# Patient Record
Sex: Female | Born: 1970 | Race: White | Hispanic: No | Marital: Married | Smoking: Never smoker
Health system: Southern US, Community
[De-identification: ages and names within clinical notes are randomized; demographics above are authoritative.]

## PROBLEM LIST (undated history)

## (undated) DIAGNOSIS — I10 Essential (primary) hypertension: Secondary | ICD-10-CM

## (undated) DIAGNOSIS — E039 Hypothyroidism, unspecified: Secondary | ICD-10-CM

## (undated) DIAGNOSIS — E119 Type 2 diabetes mellitus without complications: Secondary | ICD-10-CM

---

## 1997-10-29 ENCOUNTER — Observation Stay (HOSPITAL_COMMUNITY): Admission: RE | Admit: 1997-10-29 | Discharge: 1997-10-30 | Payer: Self-pay | Admitting: Surgery

## 1998-04-17 ENCOUNTER — Other Ambulatory Visit: Admission: RE | Admit: 1998-04-17 | Discharge: 1998-04-17 | Payer: Self-pay | Admitting: Obstetrics and Gynecology

## 1999-04-18 ENCOUNTER — Other Ambulatory Visit: Admission: RE | Admit: 1999-04-18 | Discharge: 1999-04-18 | Payer: Self-pay | Admitting: Obstetrics and Gynecology

## 2000-01-01 ENCOUNTER — Encounter: Payer: Self-pay | Admitting: Obstetrics and Gynecology

## 2000-01-01 ENCOUNTER — Ambulatory Visit (HOSPITAL_COMMUNITY): Admission: RE | Admit: 2000-01-01 | Discharge: 2000-01-01 | Payer: Self-pay | Admitting: Obstetrics and Gynecology

## 2000-04-26 ENCOUNTER — Encounter (INDEPENDENT_AMBULATORY_CARE_PROVIDER_SITE_OTHER): Payer: Self-pay | Admitting: Specialist

## 2000-04-26 ENCOUNTER — Inpatient Hospital Stay (HOSPITAL_COMMUNITY): Admission: AD | Admit: 2000-04-26 | Discharge: 2000-04-28 | Payer: Self-pay

## 2000-04-30 ENCOUNTER — Ambulatory Visit (HOSPITAL_COMMUNITY): Admission: AD | Admit: 2000-04-30 | Discharge: 2000-04-30 | Payer: Self-pay | Admitting: *Deleted

## 2000-05-25 ENCOUNTER — Other Ambulatory Visit: Admission: RE | Admit: 2000-05-25 | Discharge: 2000-05-25 | Payer: Self-pay | Admitting: Obstetrics and Gynecology

## 2001-05-31 ENCOUNTER — Other Ambulatory Visit: Admission: RE | Admit: 2001-05-31 | Discharge: 2001-05-31 | Payer: Self-pay | Admitting: Obstetrics and Gynecology

## 2002-08-14 ENCOUNTER — Other Ambulatory Visit: Admission: RE | Admit: 2002-08-14 | Discharge: 2002-08-14 | Payer: Self-pay | Admitting: Obstetrics and Gynecology

## 2008-06-17 ENCOUNTER — Emergency Department (HOSPITAL_BASED_OUTPATIENT_CLINIC_OR_DEPARTMENT_OTHER): Admission: EM | Admit: 2008-06-17 | Discharge: 2008-06-17 | Payer: Self-pay | Admitting: Emergency Medicine

## 2010-07-25 NOTE — H&P (Signed)
[facility]  Patient:    Kimberly Krueger, Kimberly Krueger                          MRN: [id number] Adm. Date:  [id number] Attending:  Esmeralda Arthur Dictator:   Silverio Lay, M.D.                         History and Physical  REASON FOR ADMISSION:         Intrauterine pregnancy at 38 weeks 1 day with regular uterine contractions.  HISTORY OF PRESENT ILLNESS:   This is a 40 year old married white female gravida 2, para 1 with a due date of May 09, 2000, being admitted at 38 weeks 1 day reporting regular uterine contractions since 4 a.m. this morning.  The patient called in at 7:30 with contractions every four minutes which were very painful and necessitating her to breathe with every contraction.  She denies any leaking of fluid, any bleeding.  She also denies any symptoms of pregnancy-induced hypertension.  PRENATAL COURSE:              Reveals blood type B positive, RPR nonreactive, rubella immune, HBsAg negative, HIV nonreactive, a 16-week AFP within normal limits.  A 20-week ultrasound revealed normal anatomy survey except for a single umbilical artery on ultrasound.  A 28-week ultrasound revealed estimated fetal weight in the 18th percentile, normal amniotic fluid, but umbilical cord poorly visualized.  One-hour glucose tolerance test was elevated, three-hour glucose tolerance test was within normal limits.  A 36-week ultrasound: Estimated fetal weight in the 37th percentile, amniotic fluid index in the 67th percentile and 35-week group B strep was negative. Prenatal course was otherwise uneventful.  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  October 1996, spontaneous vaginal delivery at 38 weeks of a female infant weighing 6 pounds 7 ounces with no complications; labor was induced for mild pregnancy-induced hypertension.  FAMILY HISTORY:               Positive for coronary artery disease, father. Positive for type 2 diabetes in father and paternal  grandmother.  SOCIAL HISTORY:               Married, nonsmoker, works as a Midwife.  PHYSICAL EXAMINATION:  VITAL SIGNS:                  Admitting blood pressure was 135/85.  GENERAL:                      Patient obviously in pain.  HEAD, EYES, EARS, NOSE AND THROAT:                       Negative.  LUNGS:                        Clear.  HEART:                        Normal.  ABDOMEN:                      Gravid, nontender, vertex presentation.  VAGINAL EXAM:                 By admitting nurse 4 cm, 80% effaced, vertex -1. Fetal heart rate  tracings reviewed and reactive.  EXTREMITIES:                  Negative. ASSESSMENT:                   1. Intrauterine pregnancy at 38 weeks 1 day.                               2. Active labor.                               3. Single umbilical artery.  PLAN:                         The patient is admitted to L&D.  Will proceed with pain management and artificial rupture of membranes. DD:  04/26/00 TD:  04/26/00 Job: [id number]

## 2011-01-13 ENCOUNTER — Other Ambulatory Visit: Payer: Self-pay | Admitting: Obstetrics and Gynecology

## 2011-01-13 DIAGNOSIS — Z1231 Encounter for screening mammogram for malignant neoplasm of breast: Secondary | ICD-10-CM

## 2011-01-27 ENCOUNTER — Ambulatory Visit
Admission: RE | Admit: 2011-01-27 | Discharge: 2011-01-27 | Disposition: A | Discharge status: Home / Self Care | Payer: BC Managed Care – PPO | Source: Ambulatory Visit | Attending: Obstetrics and Gynecology | Admitting: Obstetrics and Gynecology

## 2011-01-27 DIAGNOSIS — Z1231 Encounter for screening mammogram for malignant neoplasm of breast: Secondary | ICD-10-CM

## 2011-02-02 ENCOUNTER — Other Ambulatory Visit: Payer: Self-pay | Admitting: Obstetrics and Gynecology

## 2011-02-02 DIAGNOSIS — R928 Other abnormal and inconclusive findings on diagnostic imaging of breast: Secondary | ICD-10-CM

## 2011-02-19 ENCOUNTER — Ambulatory Visit
Admission: RE | Admit: 2011-02-19 | Discharge: 2011-02-19 | Disposition: A | Discharge status: Home / Self Care | Payer: BC Managed Care – PPO | Source: Ambulatory Visit | Attending: Obstetrics and Gynecology | Admitting: Obstetrics and Gynecology

## 2011-02-19 DIAGNOSIS — R928 Other abnormal and inconclusive findings on diagnostic imaging of breast: Secondary | ICD-10-CM

## 2011-07-16 ENCOUNTER — Other Ambulatory Visit: Payer: Self-pay | Admitting: Obstetrics and Gynecology

## 2011-07-16 DIAGNOSIS — N6009 Solitary cyst of unspecified breast: Secondary | ICD-10-CM

## 2011-08-19 ENCOUNTER — Ambulatory Visit
Admission: RE | Admit: 2011-08-19 | Discharge: 2011-08-19 | Disposition: A | Discharge status: Home / Self Care | Payer: BC Managed Care – PPO | Source: Ambulatory Visit | Attending: Obstetrics and Gynecology | Admitting: Obstetrics and Gynecology

## 2011-08-19 DIAGNOSIS — N6009 Solitary cyst of unspecified breast: Secondary | ICD-10-CM

## 2012-01-11 ENCOUNTER — Other Ambulatory Visit: Payer: Self-pay | Admitting: Obstetrics and Gynecology

## 2012-01-11 DIAGNOSIS — N63 Unspecified lump in unspecified breast: Secondary | ICD-10-CM

## 2012-02-22 ENCOUNTER — Ambulatory Visit
Admission: RE | Admit: 2012-02-22 | Discharge: 2012-02-22 | Disposition: A | Discharge status: Home / Self Care | Payer: BC Managed Care – PPO | Source: Ambulatory Visit | Attending: Obstetrics and Gynecology | Admitting: Obstetrics and Gynecology

## 2012-02-22 DIAGNOSIS — N63 Unspecified lump in unspecified breast: Secondary | ICD-10-CM

## 2013-01-24 ENCOUNTER — Other Ambulatory Visit: Payer: Self-pay | Admitting: Obstetrics and Gynecology

## 2013-01-24 DIAGNOSIS — N63 Unspecified lump in unspecified breast: Secondary | ICD-10-CM

## 2013-02-24 ENCOUNTER — Ambulatory Visit
Admission: RE | Admit: 2013-02-24 | Discharge: 2013-02-24 | Disposition: A | Discharge status: Home / Self Care | Payer: BC Managed Care – PPO | Source: Ambulatory Visit | Attending: Obstetrics and Gynecology | Admitting: Obstetrics and Gynecology

## 2013-02-24 DIAGNOSIS — N63 Unspecified lump in unspecified breast: Secondary | ICD-10-CM

## 2014-01-30 ENCOUNTER — Other Ambulatory Visit: Payer: Self-pay

## 2014-01-30 DIAGNOSIS — Z1231 Encounter for screening mammogram for malignant neoplasm of breast: Secondary | ICD-10-CM

## 2014-02-26 ENCOUNTER — Ambulatory Visit
Admission: RE | Admit: 2014-02-26 | Discharge: 2014-02-26 | Disposition: A | Discharge status: Home / Self Care | Payer: BC Managed Care – PPO | Source: Ambulatory Visit

## 2014-02-26 DIAGNOSIS — Z1231 Encounter for screening mammogram for malignant neoplasm of breast: Secondary | ICD-10-CM

## 2019-05-19 ENCOUNTER — Ambulatory Visit: Payer: 59 | Attending: Internal Medicine

## 2019-05-19 DIAGNOSIS — Z23 Encounter for immunization: Secondary | ICD-10-CM

## 2019-05-19 NOTE — Progress Notes (Signed)
   Covid-19 Vaccination Clinic  Name:  Kimberly Krueger    MRN: [id number] DOB: February 03, 1971  05/19/2019  Kimberly Krueger was observed post Covid-19 immunization for 15 minutes without incident. She was provided with Vaccine Information Sheet and instruction to access the V-Safe system.   Kimberly Krueger was instructed to call 911 with any severe reactions post vaccine: Marland Kitchen Difficulty breathing  . Swelling of face and throat  . A fast heartbeat  . A bad rash all over body  . Dizziness and weakness   Immunizations Administered    Name Date Dose VIS Date Route   Pfizer COVID-19 Vaccine 05/19/2019  3:55 PM 0.3 mL 02/17/2019 Intramuscular   Manufacturer: [employer], [employer]   Lot: [id number]   NDC: [address]-3

## 2019-06-12 ENCOUNTER — Ambulatory Visit: Payer: 59

## 2019-06-12 ENCOUNTER — Ambulatory Visit: Payer: 59 | Attending: Internal Medicine

## 2019-06-12 DIAGNOSIS — Z23 Encounter for immunization: Secondary | ICD-10-CM

## 2019-06-12 NOTE — Progress Notes (Signed)
   Covid-19 Vaccination Clinic  Name:  Kimberly Krueger    MRN: [id number] DOB: Sep 15, 1970  06/12/2019  Kimberly Krueger was observed post Covid-19 immunization for 15 minutes without incident. She was provided with Vaccine Information Sheet and instruction to access the V-Safe system.   Kimberly Krueger was instructed to call 911 with any severe reactions post vaccine: Marland Kitchen Difficulty breathing  . Swelling of face and throat  . A fast heartbeat  . A bad rash all over body  . Dizziness and weakness   Immunizations Administered    Name Date Dose VIS Date Route   Pfizer COVID-19 Vaccine 06/12/2019 10:31 AM 0.3 mL 02/17/2019 Intramuscular   Manufacturer: [employer], [employer]   Lot: [id number]   NDC: [address]-4

## 2021-02-19 ENCOUNTER — Encounter (HOSPITAL_BASED_OUTPATIENT_CLINIC_OR_DEPARTMENT_OTHER): Payer: Self-pay

## 2021-02-19 ENCOUNTER — Other Ambulatory Visit: Payer: Self-pay

## 2021-02-19 ENCOUNTER — Emergency Department (HOSPITAL_BASED_OUTPATIENT_CLINIC_OR_DEPARTMENT_OTHER)
Admission: EM | Admit: 2021-02-19 | Discharge: 2021-02-19 | Disposition: A | Discharge status: Home / Self Care | Payer: 59 | Attending: Emergency Medicine | Admitting: Emergency Medicine

## 2021-02-19 ENCOUNTER — Emergency Department (HOSPITAL_BASED_OUTPATIENT_CLINIC_OR_DEPARTMENT_OTHER): Payer: 59

## 2021-02-19 DIAGNOSIS — E119 Type 2 diabetes mellitus without complications: Secondary | ICD-10-CM | POA: Diagnosis not present

## 2021-02-19 DIAGNOSIS — R110 Nausea: Secondary | ICD-10-CM | POA: Insufficient documentation

## 2021-02-19 DIAGNOSIS — I10 Essential (primary) hypertension: Secondary | ICD-10-CM | POA: Insufficient documentation

## 2021-02-19 DIAGNOSIS — R002 Palpitations: Secondary | ICD-10-CM | POA: Insufficient documentation

## 2021-02-19 DIAGNOSIS — R079 Chest pain, unspecified: Secondary | ICD-10-CM

## 2021-02-19 DIAGNOSIS — E039 Hypothyroidism, unspecified: Secondary | ICD-10-CM | POA: Diagnosis not present

## 2021-02-19 HISTORY — DX: Essential (primary) hypertension: I10

## 2021-02-19 HISTORY — DX: Type 2 diabetes mellitus without complications: E11.9

## 2021-02-19 HISTORY — DX: Hypothyroidism, unspecified: E03.9

## 2021-02-19 LAB — TROPONIN I (HIGH SENSITIVITY)
Troponin I (High Sensitivity): <2 ng/L (ref <18)
Troponin I (High Sensitivity): <2 ng/L (ref <18)

## 2021-02-19 LAB — BASIC METABOLIC PANEL
Anion gap: 10 (ref 5–15)
BUN: 17 mg/dL (ref 6–20)
CO2: 22 mmol/L (ref 22–32)
Calcium: 9 mg/dL (ref 8.9–10.3)
Chloride: 104 mmol/L (ref 98–111)
Creatinine, Ser: 0.76 mg/dL (ref 0.44–1.00)
GFR, Estimated: >60 mL/min (ref >60)
Glucose, Bld: 163 mg/dL — ABNORMAL HIGH (ref 70–99)
Potassium: 3.8 mmol/L (ref 3.5–5.1)
Sodium: 136 mmol/L (ref 135–145)

## 2021-02-19 LAB — MAGNESIUM: Magnesium: 2.3 mg/dL (ref 1.7–2.4)

## 2021-02-19 LAB — CBC
HCT: 43.5 % (ref 36.0–46.0)
Hemoglobin: 14.9 g/dL (ref 12.0–15.0)
MCH: 30.2 pg (ref 26.0–34.0)
MCHC: 34.3 g/dL (ref 30.0–36.0)
MCV: 88.2 fL (ref 80.0–100.0)
Platelets: 289 10*3/uL (ref 150–400)
RBC: 4.93 MIL/uL (ref 3.87–5.11)
RDW: 12.9 % (ref 11.5–15.5)
WBC: 6.6 10*3/uL (ref 4.0–10.5)
nRBC: 0 % (ref 0.0–0.2)

## 2021-02-19 LAB — TSH: TSH: 1.428 u[IU]/mL (ref 0.350–4.500)

## 2021-02-19 MED ORDER — ASPIRIN 81 MG PO CHEW
324.0000 mg | CHEWABLE_TABLET | Freq: Once | ORAL | Status: AC
  Administered 2021-02-19: 07:00:00 324 mg via ORAL
  Filled 2021-02-19: qty 4

## 2021-02-19 NOTE — Discharge Instructions (Signed)
Please follow-up with your primary care doctor to discuss your episode of chest pain.  Come back to ER if you develop any worsening episodes of chest pain, difficulty breathing or other new concerning symptom.

## 2021-02-19 NOTE — ED Triage Notes (Signed)
Pt states she was asleep and started having sharp pains in left chest and left shoulder, shortness of breath and nausea.  Pt states she has been having heaviness in chest x 2 weeks intermittently.

## 2021-02-19 NOTE — ED Notes (Signed)
ED Provider at bedside. 

## 2021-02-19 NOTE — ED Provider Notes (Signed)
[facility]PART[facility] Provider Note   CSN: [id number] Arrival date & time: 02/19/21  0554     History Chief Complaint  Patient presents with   Chest Pain    Kimberly Krueger is a 50 y.o. female.   Chest Pain Associated symptoms: nausea and palpitations   Associated symptoms: no abdominal pain, no back pain, no cough, no dizziness, no fatigue, no fever, no headache, no numbness, no shortness of breath, no vomiting and no weakness   Patient presents for chest pain.  This occurred upon awakening this morning.  Pain was in the left side of chest and radiated to left shoulder.  She has some associated nausea.  She denies any associated shortness of breath.  She took some Tylenol.  Currently, she denies any chest pain.  She has not had any recent injuries or straining.  She denies any recent illnesses.  She does state that she has had intermittent chest pressure over the past 2 weeks.  If his pressure is not exertional.  It does appear to occur at random times.  Pressures also been located in the left side of her chest area.  Patient has no history of ACS.  Medical history is notable for T2DM, HTN, hypothyroidism.  All medications include lisinopril, metformin, Jardiance, and thyroid medicine.  Her father did have a heart attack in his 67s or 4s.  Patient does not smoke.      Past Medical History:  Diagnosis Date   Diabetes mellitus without complication ([place])    Hypertension    Hypothyroidism     There are no problems to display for this patient.   Past Surgical History:  Procedure Laterality Date   CHOLECYSTECTOMY       OB History   No obstetric history on file.     History reviewed. No pertinent family history.  Social History   Tobacco Use   Smoking status: Never   Smokeless tobacco: Never  Vaping Use   Vaping Use: Never used  Substance Use Topics   Alcohol use: Never   Drug use: Never    Home Medications Prior to Admission medications   Not  on File    Allergies    Sulfa antibiotics  Review of Systems   Review of Systems  Constitutional:  Negative for activity change, appetite change, chills, fatigue and fever.  HENT:  Negative for congestion, ear pain and sore throat.   Eyes:  Negative for pain and visual disturbance.  Respiratory:  Positive for chest tightness. Negative for cough, shortness of breath and wheezing.   Cardiovascular:  Positive for chest pain and palpitations. Negative for leg swelling.  Gastrointestinal:  Positive for nausea. Negative for abdominal pain and vomiting.  Genitourinary:  Negative for dysuria, flank pain, hematuria and pelvic pain.  Musculoskeletal:  Positive for arthralgias (Left shoulder). Negative for back pain, joint swelling, myalgias and neck pain.  Skin:  Negative for color change, pallor and rash.  Neurological:  Negative for dizziness, seizures, syncope, weakness, light-headedness, numbness and headaches.  Hematological:  Does not bruise/bleed easily.  All other systems reviewed and are negative.  Physical Exam Updated Vital Signs BP 122/73    Pulse 85    Temp 98.7 F (37.1 C) (Oral)    Resp 16    Ht 5\' 5"  (1.651 m)    Wt 95.3 kg    LMP 01/25/2021    SpO2 98%    BMI 34.95 kg/m   Physical Exam Vitals and nursing note  reviewed.  Constitutional:      General: She is not in acute distress.    Appearance: She is well-developed and normal weight. She is not ill-appearing, toxic-appearing or diaphoretic.  HENT:     Head: Normocephalic and atraumatic.  Eyes:     Conjunctiva/sclera: Conjunctivae normal.  Neck:     Vascular: No JVD.  Cardiovascular:     Rate and Rhythm: Normal rate and regular rhythm.     Heart sounds: No murmur heard.   No friction rub. No gallop.  Pulmonary:     Effort: Pulmonary effort is normal. No tachypnea or respiratory distress.     Breath sounds: Normal breath sounds. No decreased breath sounds, wheezing or rales.  Chest:     Chest wall: No tenderness.   Abdominal:     Palpations: Abdomen is soft.     Tenderness: There is no abdominal tenderness.  Musculoskeletal:        General: No swelling.     Cervical back: Normal range of motion and neck supple.     Right lower leg: No edema.     Left lower leg: No edema.  Skin:    General: Skin is warm and dry.     Capillary Refill: Capillary refill takes less than 2 seconds.     Coloration: Skin is not cyanotic or pale.  Neurological:     General: No focal deficit present.     Mental Status: She is alert and oriented to person, place, and time.     Cranial Nerves: No cranial nerve deficit.     Motor: No weakness.  Psychiatric:        Mood and Affect: Mood normal.        Behavior: Behavior normal.    ED Results / Procedures / Treatments   Labs (all labs ordered are listed, but only abnormal results are displayed) Labs Reviewed  BASIC METABOLIC PANEL - Abnormal; Notable for the following components:      Result Value   Glucose, Bld 163 (*)    All other components within normal limits  CBC  MAGNESIUM  PREGNANCY, URINE  TSH  TROPONIN I (HIGH SENSITIVITY)    EKG EKG Interpretation  Date/Time:  Wednesday February 19 2021 06:04:03 EST Ventricular Rate:  92 PR Interval:  133 QRS Duration: 94 QT Interval:  366 QTC Calculation: 453 R Axis:   84 Text Interpretation: Sinus rhythm Low voltage, precordial leads Confirmed by Gloris Manchester ([id number]) on 02/19/2021 6:09:40 AM  Radiology DG Chest 2 View  Result Date: 02/19/2021 CLINICAL DATA:  Chest pain EXAM: CHEST - 2 VIEW COMPARISON:  None. FINDINGS: Normal heart size and mediastinal contours. No acute infiltrate or edema. No effusion or pneumothorax. No acute osseous findings. IMPRESSION: Negative chest. Electronically Signed   By: Tiburcio Pea M.D.   On: 02/19/2021 06:24    Procedures Procedures   Medications Ordered in ED Medications  aspirin chewable tablet 324 mg (324 mg Oral Given 02/19/21 5621)    ED Course  I have  reviewed the triage vital signs and the nursing notes.  Pertinent labs & imaging results that were available during my care of the patient were reviewed by me and considered in my medical decision making (see chart for details).    MDM Rules/Calculators/A&P                          Patient is a 50 year old female who presents for acute onset of left-sided  chest pain this morning.  Pain did wake her up from sleep.  She did take Tylenol at home.  Pain was resolved by the time she arrived in the ED.  She denies any current nausea.  Patient is well-appearing with normal vital signs on arrival.  EKG shows no evidence of ischemia.  Based on symptoms, risk factors, and EKG findings, patient is a heart score 4.  ASA was given.  Patient to undergo lab work, including 2 troponins.  Care of patient was signed out to oncoming ED provider.  Final Clinical Impression(s) / ED Diagnoses Final diagnoses:  Chest pain, unspecified type    Rx / DC Orders ED Discharge Orders     None        Godfrey Pick, MD 02/19/21 [phone]

## 2021-02-19 NOTE — ED Provider Notes (Signed)
Signout note  50 year old lady presenting to ER with concern for episode of chest pain this morning.  Has history of diabetes, hypertension, hypothyroidism.  7:00 AM received signout from Dr. Durwin Nora, plan to reassess patient, check troponin x2  8:00 AM reassessed patient, no ongoing pain, will check repeat troponin, initial troponin is less than 2  8:47 AM repeat troponin is negative, remainder of work-up is unremarkable.  Patient remains well-appearing and asymptomatic.  Recommend follow-up with primary doctor.   Milagros Loll, MD 02/19/21 [phone]

## 2022-09-23 IMAGING — CR DG CHEST 2V
2 series · 2 of 2 positions shown · non-contrast
Comparison: None.

CLINICAL DATA: Chest pain

EXAM:
CHEST - 2 VIEW

[w chest pa]
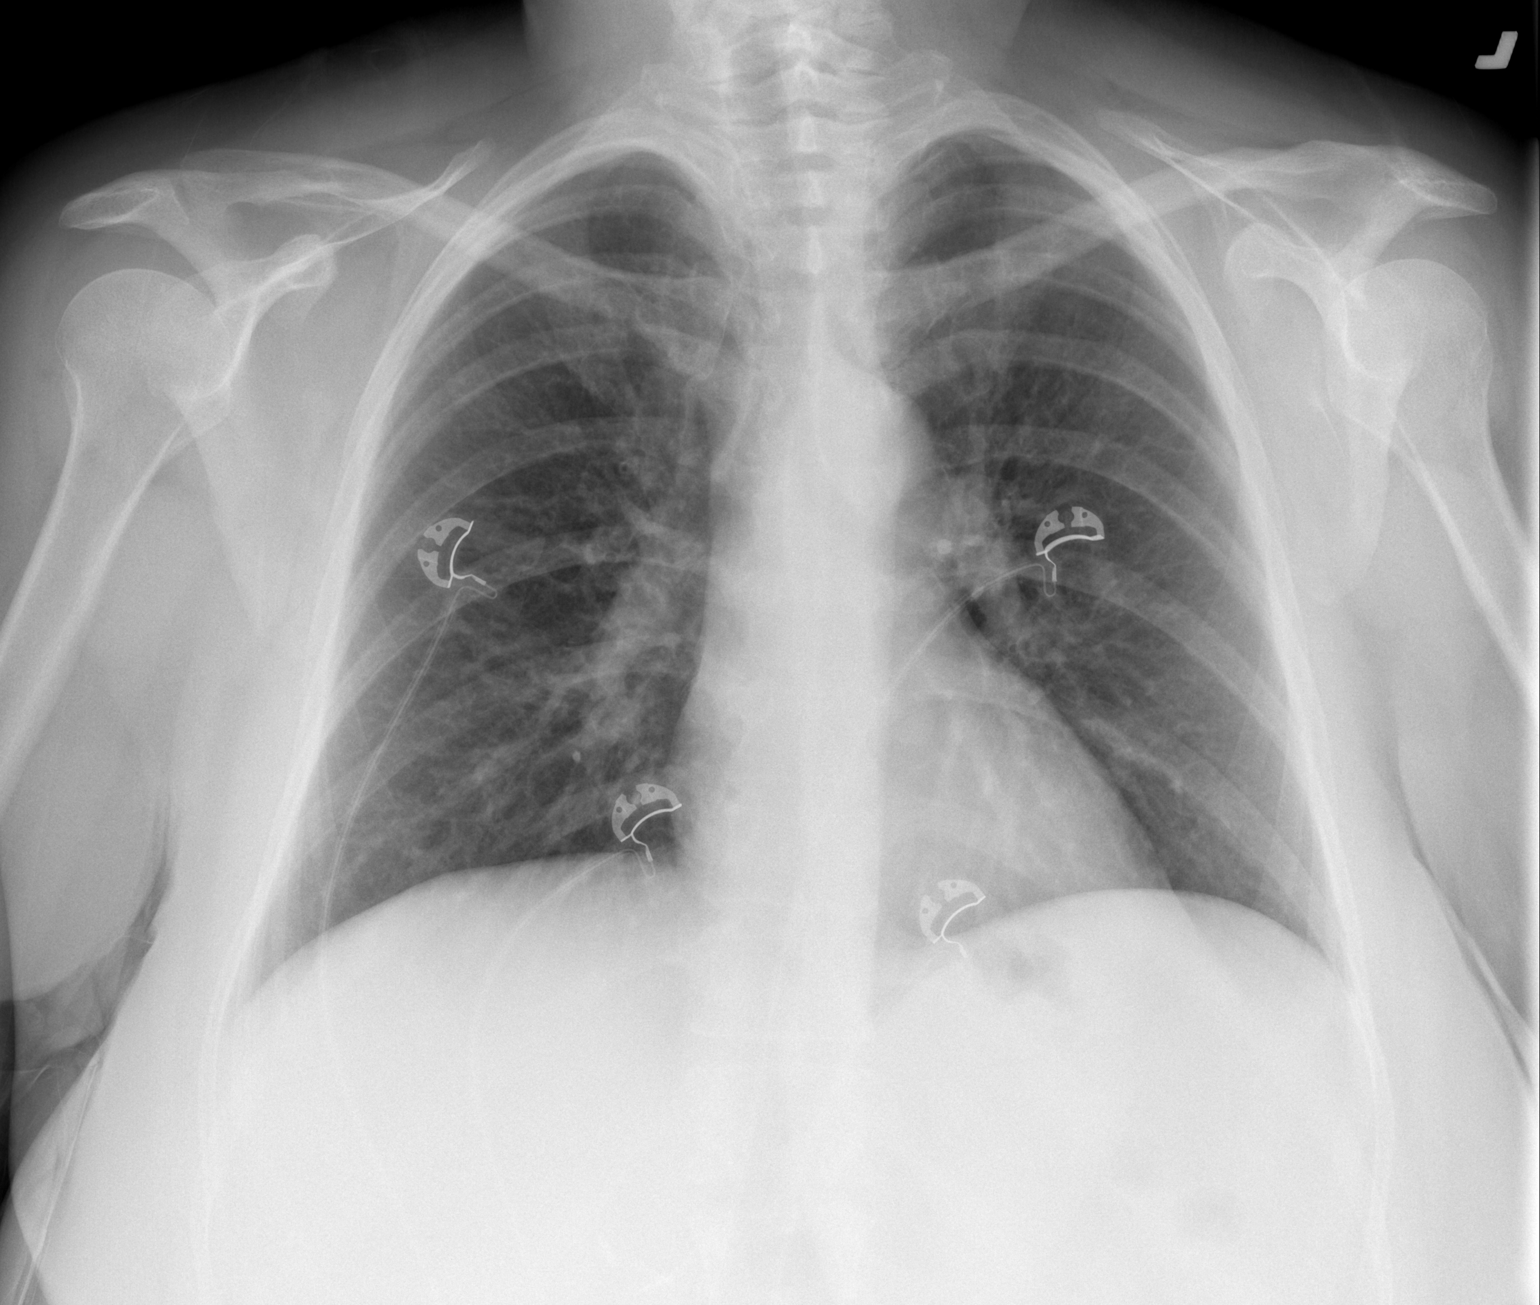

[w chest lat]
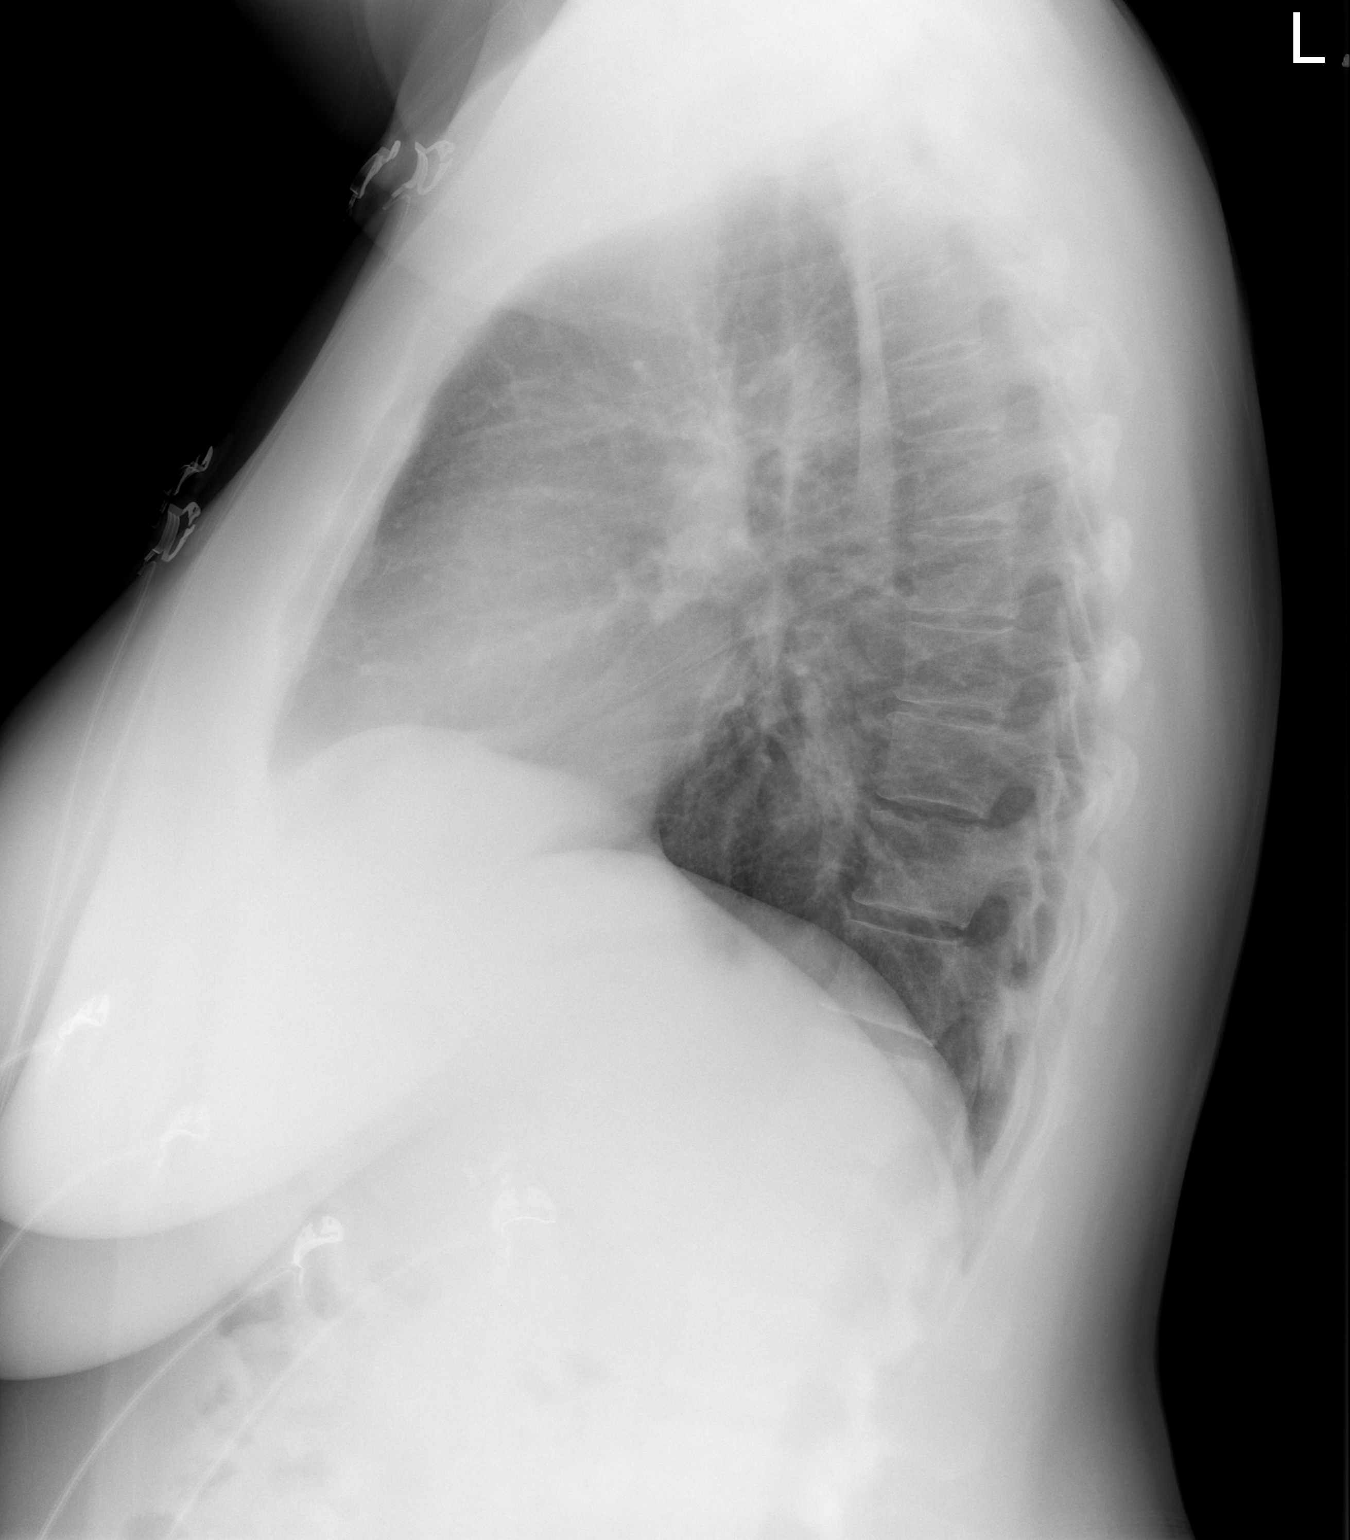

[2 of 2 positions shown; findings below may reference images not displayed]

FINDINGS: Normal heart size and mediastinal contours. No acute infiltrate or
edema. No effusion or pneumothorax. No acute osseous findings.
IMPRESSION: Negative chest.
